# Patient Record
Sex: Female | Born: 1990 | Race: Black or African American | Hispanic: No | Marital: Married | Smoking: Never smoker
Health system: Southern US, Community
[De-identification: ages and names within clinical notes are randomized; demographics above are authoritative.]

## PROBLEM LIST (undated history)

## (undated) HISTORY — PX: NO PAST SURGERIES: SHX2092

---

## 2020-03-17 ENCOUNTER — Ambulatory Visit (INDEPENDENT_AMBULATORY_CARE_PROVIDER_SITE_OTHER): Payer: 59 | Admitting: Medical

## 2020-03-17 ENCOUNTER — Other Ambulatory Visit: Payer: Self-pay

## 2020-03-17 ENCOUNTER — Encounter: Payer: Self-pay | Admitting: Family Medicine

## 2020-03-17 VITALS — BP 133/87 | HR 74 | Ht 63.0 in | Wt 126.8 lb

## 2020-03-17 DIAGNOSIS — Z3A09 9 weeks gestation of pregnancy: Secondary | ICD-10-CM | POA: Diagnosis not present

## 2020-03-17 DIAGNOSIS — Z3401 Encounter for supervision of normal first pregnancy, first trimester: Secondary | ICD-10-CM | POA: Diagnosis not present

## 2020-03-17 DIAGNOSIS — R110 Nausea: Secondary | ICD-10-CM | POA: Diagnosis not present

## 2020-03-17 DIAGNOSIS — Z3201 Encounter for pregnancy test, result positive: Secondary | ICD-10-CM

## 2020-03-17 DIAGNOSIS — Z3403 Encounter for supervision of normal first pregnancy, third trimester: Secondary | ICD-10-CM | POA: Insufficient documentation

## 2020-03-17 LAB — POCT PREGNANCY, URINE: Preg Test, Ur: POSITIVE — AB

## 2020-03-17 NOTE — Patient Instructions (Signed)
First Trimester of Pregnancy  The first trimester of pregnancy is from week 1 until the end of week 13 (months 1 through 3). During this time, your baby will begin to develop inside you. At 6-8 weeks, the eyes and face are formed, and the heartbeat can be seen on ultrasound. At the end of 12 weeks, all the baby's organs are formed. Prenatal care is all the medical care you receive before the birth of your baby. Make sure you get good prenatal care and follow all of your doctor's instructions. Follow these instructions at home: Medicines  Take over-the-counter and prescription medicines only as told by your doctor. Some medicines are safe and some medicines are not safe during pregnancy.  Take a prenatal vitamin that contains at least 600 micrograms (mcg) of folic acid.  If you have trouble pooping (constipation), take medicine that will make your stool soft (stool softener) if your doctor approves. Eating and drinking   Eat regular, healthy meals.  Your doctor will tell you the amount of weight gain that is right for you.  Avoid raw meat and uncooked cheese.  If you feel sick to your stomach (nauseous) or throw up (vomit): ? Eat 4 or 5 small meals a day instead of 3 large meals. ? Try eating a few soda crackers. ? Drink liquids between meals instead of during meals.  To prevent constipation: ? Eat foods that are high in fiber, like fresh fruits and vegetables, whole grains, and beans. ? Drink enough fluids to keep your pee (urine) clear or pale yellow. Activity  Exercise only as told by your doctor. Stop exercising if you have cramps or pain in your lower belly (abdomen) or low back.  Do not exercise if it is too hot, too humid, or if you are in a place of great height (high altitude).  Try to avoid standing for long periods of time. Move your legs often if you must stand in one place for a long time.  Avoid heavy lifting.  Wear low-heeled shoes. Sit and stand up  straight.  You can have sex unless your doctor tells you not to. Relieving pain and discomfort  Wear a good support bra if your breasts are sore.  Take warm water baths (sitz baths) to soothe pain or discomfort caused by hemorrhoids. Use hemorrhoid cream if your doctor says it is okay.  Rest with your legs raised if you have leg cramps or low back pain.  If you have puffy, bulging veins (varicose veins) in your legs: ? Wear support hose or compression stockings as told by your doctor. ? Raise (elevate) your feet for 15 minutes, 3-4 times a day. ? Limit salt in your food. Prenatal care  Schedule your prenatal visits by the twelfth week of pregnancy.  Write down your questions. Take them to your prenatal visits.  Keep all your prenatal visits as told by your doctor. This is important. Safety  Wear your seat belt at all times when driving.  Make a list of emergency phone numbers. The list should include numbers for family, friends, the hospital, and police and fire departments. General instructions  Ask your doctor for a referral to a local prenatal class. Begin classes no later than at the start of month 6 of your pregnancy.  Ask for help if you need counseling or if you need help with nutrition. Your doctor can give you advice or tell you where to go for help.  Do not use hot tubs, steam   rooms, or saunas.  Do not douche or use tampons or scented sanitary pads.  Do not cross your legs for long periods of time.  Avoid all herbs and alcohol. Avoid drugs that are not approved by your doctor.  Do not use any tobacco products, including cigarettes, chewing tobacco, and electronic cigarettes. If you need help quitting, ask your doctor. You may get counseling or other support to help you quit.  Avoid cat litter boxes and soil used by cats. These carry germs that can cause birth defects in the baby and can cause a loss of your baby (miscarriage) or stillbirth.  Visit your dentist.  At home, brush your teeth with a soft toothbrush. Be gentle when you floss. Contact a doctor if:  You are dizzy.  You have mild cramps or pressure in your lower belly.  You have a nagging pain in your belly area.  You continue to feel sick to your stomach, you throw up, or you have watery poop (diarrhea).  You have a bad smelling fluid coming from your vagina.  You have pain when you pee (urinate).  You have increased puffiness (swelling) in your face, hands, legs, or ankles. Get help right away if:  You have a fever.  You are leaking fluid from your vagina.  You have spotting or bleeding from your vagina.  You have very bad belly cramping or pain.  You gain or lose weight rapidly.  You throw up blood. It may look like coffee grounds.  You are around people who have German measles, fifth disease, or chickenpox.  You have a very bad headache.  You have shortness of breath.  You have any kind of trauma, such as from a fall or a car accident. Summary  The first trimester of pregnancy is from week 1 until the end of week 13 (months 1 through 3).  To take care of yourself and your unborn baby, you will need to eat healthy meals, take medicines only if your doctor tells you to do so, and do activities that are safe for you and your baby.  Keep all follow-up visits as told by your doctor. This is important as your doctor will have to ensure that your baby is healthy and growing well. This information is not intended to replace advice given to you by your health care provider. Make sure you discuss any questions you have with your health care provider. Document Revised: 10/29/2018 Document Reviewed: 07/16/2016 Elsevier Patient Education  2020 Elsevier Inc.  

## 2020-03-17 NOTE — Progress Notes (Signed)
Pt here for UPT-+ , LMP: 01/13/20, EDD: 10/19/20, GA: [redacted]w[redacted]d. Verified that Pt is not on any other medications, asked pt to start Prenatal vitamins as soon as possible. Pt states has been nauseated but no vomiting. Asked Pt did she know where she wanted to get prenatal care, she stated this office is closest to home her to let front office know at check out. Pt verbalized understanding.

## 2020-03-17 NOTE — Progress Notes (Signed)
  History:  Ms. Tammy Park is a 29 y.o. G1P0 who presents to clinic today with complaint of possible pregnancy. She states LMP 01/13/20 and +HPT. She denies pain, bleeding today. She has had morning nausea without vomiting.   The following portions of the patient's history were reviewed and updated as appropriate: allergies, current medications and problem list.   Review of Systems:  Pertinent items noted in HPI and remainder of comprehensive ROS otherwise negative.  Objective:  Physical Exam BP 133/87   Pulse 74   Ht 5\' 3"  (1.6 m)   Wt 126 lb 12.8 oz (57.5 kg)   LMP 01/13/2020 (Exact Date)   BMI 22.46 kg/m  Physical Exam Vitals and nursing note reviewed.  Constitutional:      General: She is not in acute distress.    Appearance: She is normal weight.  Cardiovascular:     Rate and Rhythm: Normal rate.  Pulmonary:     Effort: Pulmonary effort is normal.  Abdominal:     General: Abdomen is flat. There is no distension.  Neurological:     Mental Status: She is alert and oriented to person, place, and time.  Psychiatric:        Mood and Affect: Mood normal.     Labs and Imaging Results for orders placed or performed in visit on 03/17/20 (from the past 24 hour(s))  Pregnancy, urine POC     Status: Abnormal   Collection Time: 03/17/20  8:59 AM  Result Value Ref Range   Preg Test, Ur POSITIVE (A) NEGATIVE    Assessment & Plan:  1. Positive pregnancy test - Rx for prenatal vitamins sent to patient's pharmacy  - Discussed first trimester warning signs and when to present to [facility] - Will schedule for new OB intake/first prenatal visit in 3-4 weeks  2. [redacted] weeks gestation of pregnancy  Approximately 10 minutes of total time was spent with this patient on history taking, chart review and documentation.  03/19/20, PA-C 03/17/2020 9:30 AM

## 2020-04-13 ENCOUNTER — Other Ambulatory Visit (HOSPITAL_COMMUNITY)
Admission: RE | Admit: 2020-04-13 | Discharge: 2020-04-13 | Disposition: A | Discharge status: Home / Self Care | Payer: 59 | Source: Ambulatory Visit

## 2020-04-13 ENCOUNTER — Other Ambulatory Visit: Payer: Self-pay

## 2020-04-13 ENCOUNTER — Ambulatory Visit (INDEPENDENT_AMBULATORY_CARE_PROVIDER_SITE_OTHER): Payer: 59

## 2020-04-13 VITALS — BP 110/77 | HR 96 | Wt 126.0 lb

## 2020-04-13 DIAGNOSIS — Z3A13 13 weeks gestation of pregnancy: Secondary | ICD-10-CM

## 2020-04-13 DIAGNOSIS — Z124 Encounter for screening for malignant neoplasm of cervix: Secondary | ICD-10-CM | POA: Diagnosis present

## 2020-04-13 DIAGNOSIS — Z3401 Encounter for supervision of normal first pregnancy, first trimester: Secondary | ICD-10-CM

## 2020-04-13 NOTE — Progress Notes (Signed)
Subjective:   Tammy Park is a 29 y.o. G1P0 at [redacted]w[redacted]d by Definite LMP of January 13, 2020 being seen today for her first obstetrical visit for her first pregnancy!  Patient and partner reports this was a planned pregnancy.  Patient and husband report that they married in [place] this past May and patient relocated to [place] in July. Patient and husband feel she is adjusting well.   Gynecological/Obstetrical History: Her obstetrical history is unremarkable. Patient does intend to breast feed. Pregnancy history fully reviewed. Patient reports no complaints. Sexual Activity and Vaginal Concerns: Some occasional pain or discomfort during sex, but not of concern.  Patient states "it just happens in the moment and then it is done."   Medical History/ROS: Patient denies significant medical history.     Social History: Patient denies history or current usage of tobacco, alcohol, or drugs.  Patient reports the FOB is Darin Engels who is present, involved, and supportive.  Patient reports that she lives with Darin Engels and endorses safety at home.  Patient denies DV/A. Patient is not currently employed.  HISTORY: OB History  Gravida Para Term Preterm AB Living  1 0 0 0 0 0  SAB TAB Ectopic Multiple Live Births  0 0 0 0 0    # Outcome Date GA Lbr Len/2nd Weight Sex Delivery Anes PTL Lv  1 Current             Last pap smear was done today and is pending.   No past medical history on file. No past surgical history on file. No family history on file. Social History   Tobacco Use  . Smoking status: Unknown If Ever Smoked  Substance Use Topics  . Alcohol use: Not on file  . Drug use: Not on file   No Known Allergies Current Outpatient Medications on File Prior to Visit  Medication Sig Dispense Refill  . Prenatal Vit-Fe Fumarate-FA (PRENATAL MULTIVITAMIN) TABS tablet Take 1 tablet by mouth daily at 12 noon.     No current facility-administered medications on file prior to visit.    Review of  Systems Pertinent items noted in HPI and remainder of comprehensive ROS otherwise negative.  Exam   There were no vitals filed for this visit.    Physical Exam Constitutional:      Appearance: Normal appearance.  Genitourinary:     Genitourinary Comments: Sterile Speculum Exam: -Normal External Genitalia: Non tender, no apparent discharge at introitus.  -Vaginal Vault: Pink mucosa with good rugae. Small amt thin white discharge - -Cervix:Pink, no lesions, cysts, or polyps.  Ecotropian appearance.  OS appears closed. No active bleeding noted-pap collected with brush and spatula -Bimanual Exam:  Uterine size difficult to assess d/t position.    HENT:     Head: Normocephalic and atraumatic.  Eyes:     Conjunctiva/sclera: Conjunctivae normal.  Cardiovascular:     Rate and Rhythm: Normal rate and regular rhythm.     Heart sounds: Normal heart sounds.  Pulmonary:     Effort: Pulmonary effort is normal. No respiratory distress.     Breath sounds: Normal breath sounds.  Chest:     Comments: CBE Deferred Abdominal:     General: Abdomen is flat. There is no distension.     Palpations: Abdomen is soft.     Tenderness: There is no abdominal tenderness.  Musculoskeletal:     Cervical back: Normal range of motion.  Neurological:     Mental Status: She is alert and oriented  to person, place, and time.  Skin:    General: Skin is warm and dry.  Psychiatric:        Mood and Affect: Mood normal.        Behavior: Behavior normal.        Thought Content: Thought content normal.        Judgment: Judgment normal.     Assessment:   29 y.o. year old G1P0 Patient Active Problem List   Diagnosis Date Noted  . Supervision of low-risk first pregnancy, first trimester 03/17/2020     Plan:  1. Supervision of low-risk first pregnancy, first trimester -Congratulations given and patient welcomed to practice. -Discussed usage of Babyscripts and virtual visits as additional source of managing  and completing PN visits in midst of coronavirus.   *Instructed to take blood pressure and record weekly into babyscripts. *Reviewed modified prenatal visit schedule and platforms used for virtual visits.  -Anticipatory guidance for prenatal visits including labs, ultrasounds, and testing; Initial labs drawn. -Genetic Screening discussed, First trimester screen and NIPS: ordered. -Encouraged to complete MyChart Registration for her ability to review results, send requests, and have questions addressed.  -Discussed estimated due date of October 19, 2020. -Ultrasound discussed; fetal anatomic survey: ordered. -Initiate prenatal vitamins; Rx sent to pharmacy on file.  -Influenza offered and declined. -Covid injection discussed and patient does not plan to receive during pregnancy. Partner has received his Covid shot.  -Encouraged to seek out care at office or emergency room for urgent and/or emergent concerns. -Educated on the nature of [facility] with multiple MDs and other Advanced Practice Providers was explained to patient; also emphasized that residents, students are part of our team. Informed of her right to refuse care as she deems appropriate.  -No questions or concerns.   2. [redacted] weeks gestation of pregnancy -Anticipatory guidance for upcoming appts. -Extensive discussion regarding Covid vaccine including current recommendations, research, and risks vs benefits. -Patient and husband encouraged to research, ask questions, and receive vaccine as they deem fit for their family and lifestyle.   3. Cervical cancer screening -Introduced to instruments used in well woman exam. -Discussed reason for testing and releasing of results.  -Pap smear collected.    Problem list reviewed and updated. Routine obstetric precautions reviewed.  Orders Placed This Encounter  Procedures  . Culture, OB Urine  . [place] MFM OB COMP + 14 WK    Standing Status:   Future     Standing Expiration Date:   04/13/2021    Order Specific Question:   Reason for Exam (SYMPTOM  OR DIAGNOSIS REQUIRED)    Answer:   Anatomy    Order Specific Question:   Preferred Location    Answer:   [facility] Ul[facility]  . CBC/D/Plt+RPR+Rh+ABO+Rub Ab...  . Genetic Screening    [facility]    Return in about 5 weeks (around 05/18/2020) for Virtual LR-ROB.     Cherre Robins, CNM 04/13/2020 9:10 AM

## 2020-04-13 NOTE — Patient Instructions (Signed)

## 2020-04-15 LAB — CULTURE, OB URINE

## 2020-04-15 LAB — URINE CULTURE, OB REFLEX: Organism ID, Bacteria: NO GROWTH

## 2020-04-16 LAB — CBC/D/PLT+RPR+RH+ABO+RUB AB...
Antibody Screen: NEGATIVE
Basophils Absolute: 0.1 10*3/uL (ref 0.0–0.2)
Basos: 1 %
EOS (ABSOLUTE): 0.1 10*3/uL (ref 0.0–0.4)
Eos: 1 %
HCV Ab: <0.1 s/co ratio (ref 0.0–0.9)
HIV Screen 4th Generation wRfx: NONREACTIVE
Hematocrit: 36.8 % (ref 34.0–46.6)
Hemoglobin: 12.3 g/dL (ref 11.1–15.9)
Hepatitis B Surface Ag: NEGATIVE
Immature Grans (Abs): 0 10*3/uL (ref 0.0–0.1)
Immature Granulocytes: 0 %
Lymphocytes Absolute: 1.6 10*3/uL (ref 0.7–3.1)
Lymphs: 17 %
MCH: 30.1 pg (ref 26.6–33.0)
MCHC: 33.4 g/dL (ref 31.5–35.7)
MCV: 90 fL (ref 79–97)
Monocytes Absolute: 0.7 10*3/uL (ref 0.1–0.9)
Monocytes: 8 %
Neutrophils Absolute: 7.2 10*3/uL — ABNORMAL HIGH (ref 1.4–7.0)
Neutrophils: 73 %
Platelets: 191 10*3/uL (ref 150–450)
RBC: 4.08 x10E6/uL (ref 3.77–5.28)
RDW: 13.8 % (ref 11.7–15.4)
RPR Ser Ql: NONREACTIVE
Rh Factor: POSITIVE
Rubella Antibodies, IGG: 18.7 index (ref >0.99)
WBC: 9.8 10*3/uL (ref 3.4–10.8)

## 2020-04-16 LAB — HCV INTERPRETATION

## 2020-04-17 LAB — CYTOLOGY - PAP
Chlamydia: NEGATIVE
Comment: NEGATIVE
Comment: NEGATIVE
Comment: NORMAL
Diagnosis: UNDETERMINED — AB
High risk HPV: NEGATIVE
Neisseria Gonorrhea: NEGATIVE

## 2020-04-18 ENCOUNTER — Encounter (INDEPENDENT_AMBULATORY_CARE_PROVIDER_SITE_OTHER): Payer: Self-pay | Admitting: Primary Care

## 2020-04-28 ENCOUNTER — Encounter (INDEPENDENT_AMBULATORY_CARE_PROVIDER_SITE_OTHER): Payer: Self-pay | Admitting: Primary Care

## 2020-04-28 ENCOUNTER — Ambulatory Visit (INDEPENDENT_AMBULATORY_CARE_PROVIDER_SITE_OTHER): Payer: 59 | Admitting: Primary Care

## 2020-04-28 ENCOUNTER — Other Ambulatory Visit: Payer: Self-pay

## 2020-04-28 VITALS — BP 128/80 | HR 71 | Temp 97.5°F | Ht 64.0 in | Wt 131.8 lb

## 2020-04-28 DIAGNOSIS — Z7689 Persons encountering health services in other specified circumstances: Secondary | ICD-10-CM | POA: Diagnosis not present

## 2020-04-28 DIAGNOSIS — Z3401 Encounter for supervision of normal first pregnancy, first trimester: Secondary | ICD-10-CM

## 2020-04-28 NOTE — Addendum Note (Signed)
Addended by: Grayce Sessions on: 04/28/2020 09:26 AM   Modules accepted: Orders

## 2020-04-28 NOTE — Patient Instructions (Signed)
Earwax Buildup, Adult The ears produce a substance called earwax that helps keep bacteria out of the ear and protects the skin in the ear canal. Occasionally, earwax can build up in the ear and cause discomfort or hearing loss. What increases the risk? This condition is more likely to develop in people who:  Are female.  Are elderly.  Naturally produce more earwax.  Clean their ears often with cotton swabs.  Use earplugs often.  Use in-ear headphones often.  Wear hearing aids.  Have narrow ear canals.  Have earwax that is overly thick or sticky.  Have eczema.  Are dehydrated.  Have excess hair in the ear canal. What are the signs or symptoms? Symptoms of this condition include:  Reduced or muffled hearing.  A feeling of fullness in the ear or feeling that the ear is plugged.  Fluid coming from the ear.  Ear pain.  Ear itch.  Ringing in the ear.  Coughing.  An obvious piece of earwax that can be seen inside the ear canal. How is this diagnosed? This condition may be diagnosed based on:  Your symptoms.  Your medical history.  An ear exam. During the exam, your health care provider will look into your ear with an instrument called an otoscope. You may have tests, including a hearing test. How is this treated? This condition may be treated by:  Using ear drops to soften the earwax.  Having the earwax removed by a health care provider. The health care provider may: ? Flush the ear with water. ? Use an instrument that has a loop on the end (curette). ? Use a suction device.  Surgery to remove the wax buildup. This may be done in severe cases. Follow these instructions at home:   Take over-the-counter and prescription medicines only as told by your health care provider.  Do not put any objects, including cotton swabs, into your ear. You can clean the opening of your ear canal with a washcloth or facial tissue.  Follow instructions from your health care  provider about cleaning your ears. Do not over-clean your ears.  Drink enough fluid to keep your urine clear or pale yellow. This will help to thin the earwax.  Keep all follow-up visits as told by your health care provider. If earwax builds up in your ears often or if you use hearing aids, consider seeing your health care provider for routine, preventive ear cleanings. Ask your health care provider how often you should schedule your cleanings.  If you have hearing aids, clean them according to instructions from the manufacturer and your health care provider. Contact a health care provider if:  You have ear pain.  You develop a fever.  You have blood, pus, or other fluid coming from your ear.  You have hearing loss.  You have ringing in your ears that does not go away.  Your symptoms do not improve with treatment.  You feel like the room is spinning (vertigo). Summary  Earwax can build up in the ear and cause discomfort or hearing loss.  The most common symptoms of this condition include reduced or muffled hearing and a feeling of fullness in the ear or feeling that the ear is plugged.  This condition may be diagnosed based on your symptoms, your medical history, and an ear exam.  This condition may be treated by using ear drops to soften the earwax or by having the earwax removed by a health care provider.  Do not put any   objects, including cotton swabs, into your ear. You can clean the opening of your ear canal with a washcloth or facial tissue. This information is not intended to replace advice given to you by your health care provider. Make sure you discuss any questions you have with your health care provider. Document Revised: 06/20/2017 Document Reviewed: 09/18/2016 Elsevier Patient Education  2020 Elsevier Inc.  

## 2020-04-28 NOTE — Progress Notes (Addendum)
New Patient Office Visit  Subjective:  Patient ID: Tammy Park, female    DOB: October 31, 1990  Age: 29 y.o. MRN: [id number]  CC:  Chief Complaint  Patient presents with  . New Patient (Initial Visit)    HPI Ms. Tammy Park is a 29 year old female who presents for establishment of care. She is [redacted] weeks pregnant and 1 day will refer to gyn . She has established prenatal care with [facility]. Continue follow up appointments  No past medical history on file.  No past surgical history on file.  No family history on file.  Social History   Socioeconomic History  . Marital status: Married    Spouse name: Not on file  . Number of children: Not on file  . Years of education: Not on file  . Highest education level: Not on file  Occupational History  . Not on file  Tobacco Use  . Smoking status: Never Smoker  . Smokeless tobacco: Never Used  Substance and Sexual Activity  . Alcohol use: Never  . Drug use: Never  . Sexual activity: Yes  Other Topics Concern  . Not on file  Social History Narrative  . Not on file   Social Determinants of Health   Financial Resource Strain: Low Risk   . Difficulty of Paying Living Expenses: Not hard at all  Food Insecurity: No Food Insecurity  . Worried About Programme researcher, broadcasting/film/video in the Last Year: Never true  . Ran Out of Food in the Last Year: Never true  Transportation Needs: No Transportation Needs  . Lack of Transportation (Medical): No  . Lack of Transportation (Non-Medical): No  Physical Activity:   . Days of Exercise per Week: Not on file  . Minutes of Exercise per Session: Not on file  Stress: No Stress Concern Present  . Feeling of Stress : Not at all  Social Connections: Socially Integrated  . Frequency of Communication with Friends and Family: Three times a week  . Frequency of Social Gatherings with Friends and Family: Twice a week  . Attends Religious Services: 1 to 4 times per year  . Active Member of  Clubs or Organizations: No  . Attends Banker Meetings: More than 4 times per year  . Marital Status: Married  Catering manager Violence: Not At Risk  . Fear of Current or Ex-Partner: No  . Emotionally Abused: No  . Physically Abused: No  . Sexually Abused: No    ROS Review of Systems  Gastrointestinal: Positive for nausea.       Intermittent pregnant   All other systems reviewed and are negative.   Objective:  BP 128/80 (BP Location: Right Arm, Patient Position: Sitting, Cuff Size: Normal)   Pulse 71   Temp (!) 97.5 F (36.4 C) (Temporal)   Ht 5\' 4"  (1.626 m)   Wt 131 lb 12.8 oz (59.8 kg)   LMP 01/13/2020 (Exact Date)   SpO2 95%   BMI 22.62 kg/m  Today's Vitals: BP 128/80 (BP Location: Right Arm, Patient Position: Sitting, Cuff Size: Normal)   Pulse 71   Temp (!) 97.5 F (36.4 C) (Temporal)   Ht 5\' 4"  (1.626 m)   Wt 131 lb 12.8 oz (59.8 kg)   LMP 01/13/2020 (Exact Date)   SpO2 95%   BMI 22.62 kg/m   Physical Exam Vitals reviewed.  Constitutional:      Appearance: Normal appearance.  HENT:     Head: Normocephalic.  Right Ear: There is impacted cerumen.     Left Ear: Tympanic membrane normal.     Nose: Nose normal.  Cardiovascular:     Rate and Rhythm: Normal rate and regular rhythm.  Pulmonary:     Effort: Pulmonary effort is normal.     Breath sounds: Normal breath sounds.  Abdominal:     General: Bowel sounds are normal.  Musculoskeletal:        General: Normal range of motion.     Cervical back: Normal range of motion.  Skin:    General: Skin is warm and dry.  Neurological:     Mental Status: She is alert and oriented to person, place, and time.  Psychiatric:        Mood and Affect: Mood normal.        Behavior: Behavior normal.        Thought Content: Thought content normal.        Judgment: Judgment normal.     Assessment & Plan:  Tammy Park was seen today for new patient (initial visit).  Diagnoses and all orders for this  visit:  Encounter to establish care Gwinda Passe, NP-C will be your  (PCP) she is mastered prepared . Able to diagnosed and treatment also  answer health concern as well as continuing care of varied medical conditions, not limited by cause, organ system, or diagnosis.   Supervision of low-risk first pregnancy, first trimester She has established prenatal care with [facility]. Continue follow up appointments     Outpatient Encounter Medications as of 04/28/2020  Medication Sig  . Prenatal Vit-Fe Fumarate-FA (PRENATAL MULTIVITAMIN) TABS tablet Take 1 tablet by mouth daily at 12 noon.   No facility-administered encounter medications on file as of 04/28/2020.    Follow-up: Return for schedule for right ear irrigation when she has gyn appt.   Grayce Sessions, NP

## 2020-05-09 ENCOUNTER — Ambulatory Visit (INDEPENDENT_AMBULATORY_CARE_PROVIDER_SITE_OTHER): Payer: 59 | Admitting: Primary Care

## 2020-05-10 ENCOUNTER — Encounter: Payer: Self-pay | Admitting: *Deleted

## 2020-05-11 ENCOUNTER — Encounter: Payer: Self-pay | Admitting: Medical

## 2020-05-12 ENCOUNTER — Other Ambulatory Visit: Payer: Self-pay | Admitting: *Deleted

## 2020-05-12 DIAGNOSIS — Z3401 Encounter for supervision of normal first pregnancy, first trimester: Secondary | ICD-10-CM

## 2020-05-12 MED ORDER — COMPLETENATE 29-1 MG PO CHEW: 1.0000 | CHEWABLE_TABLET | Freq: Every day | ORAL | 11 refills | Status: DC

## 2020-05-15 ENCOUNTER — Other Ambulatory Visit: Payer: Self-pay | Admitting: *Deleted

## 2020-05-15 DIAGNOSIS — Z3401 Encounter for supervision of normal first pregnancy, first trimester: Secondary | ICD-10-CM

## 2020-05-17 ENCOUNTER — Telehealth (INDEPENDENT_AMBULATORY_CARE_PROVIDER_SITE_OTHER): Payer: 59 | Admitting: Nurse Practitioner

## 2020-05-17 VITALS — BP 119/83 | HR 80 | Wt 134.0 lb

## 2020-05-17 DIAGNOSIS — Z3A17 17 weeks gestation of pregnancy: Secondary | ICD-10-CM

## 2020-05-17 DIAGNOSIS — Z3401 Encounter for supervision of normal first pregnancy, first trimester: Secondary | ICD-10-CM

## 2020-05-17 NOTE — Progress Notes (Signed)
I connected with  Tammy Park on 05/17/20 at 0817 by MyChart and verified that I am speaking with the correct person using two identifiers.   I discussed the limitations, risks, security and privacy concerns of performing an evaluation and management service by telephone and the availability of in person appointments. I also discussed with the patient that there may be a patient responsible charge related to this service. The patient expressed understanding and agreed to proceed.  Marjo Bicker, RN 05/17/2020  8:17 AM

## 2020-05-17 NOTE — Progress Notes (Signed)
17 weeks   OBSTETRICS PRENATAL VIRTUAL VISIT ENCOUNTER NOTE  Provider location: Center for [facility] at [facility]   I connected with Earlene Plater on 05/17/20 at  8:15 AM EDT by MyChart Video Encounter at home and verified that I am speaking with the correct person using two identifiers.   I discussed the limitations, risks, security and privacy concerns of performing an evaluation and management service virtually and the availability of in person appointments. I also discussed with the patient that there may be a patient responsible charge related to this service. The patient expressed understanding and agreed to proceed. Subjective:  Yentl Verge is a 29 y.o. G1P0 at [redacted]w[redacted]d being seen today for ongoing prenatal care.  She is currently monitored for the following issues for this low-risk pregnancy and has Supervision of low-risk first pregnancy, first trimester on their problem list.  Patient reports worried about protein intake.  Contractions: Not present. Vag. Bleeding: None.  Movement: Absent. Denies any leaking of fluid.   The following portions of the patient's history were reviewed and updated as appropriate: allergies, current medications, past family history, past medical history, past social history, past surgical history and problem list.   Objective:   Vitals:   05/17/20 0818  BP: 119/83  Pulse: 80  Weight: 134 lb (60.8 kg)    Fetal Status:     Movement: Absent     General:  Alert, oriented and cooperative. Patient is in no acute distress.  Respiratory: Normal respiratory effort, no problems with respiration noted  Mental Status: Normal mood and affect. Normal behavior. Normal judgment and thought content.  Rest of physical exam deferred due to type of encounter  Imaging: No results found.  Assessment and Plan:  Pregnancy: G1P0 at [redacted]w[redacted]d 1. Supervision of low-risk first pregnancy, first trimester Client has moved to [place]. Will plan  to get AFP drawn at this office when she comes for ultrasound on 05-26-20. Will also transfer her to [facility] office as it is closer to her. Established care at [facility] and will go there to have her ear flushed. Reviewed calling this office for any medical problems during the pregnancy. Reviewed signing up for childbirth and breastfeeding classes on [employer] website. Reviewed sources of protein for her diet.  Doing well.  Not able to eat as much fish as she would like.  Preterm labor symptoms and general obstetric precautions including but not limited to vaginal bleeding, contractions, leaking of fluid and fetal movement were reviewed in detail with the patient. I discussed the assessment and treatment plan with the patient. The patient was provided an opportunity to ask questions and all were answered. The patient agreed with the plan and demonstrated an understanding of the instructions. The patient was advised to call back or seek an in-person office evaluation/go to MAU at [facility] for any urgent or concerning symptoms. Please refer to After Visit Summary for other counseling recommendations.   I provided 10 minutes of face-to-face time during this encounter.  Return for Needs lab only visit for 11/5 and needs to transfer to [facility] for ob visits - next one in 4 wk.  Future Appointments  Date Time Provider Department Center  05/26/2020  8:15 AM [facility] US2 [facility]    Currie Paris, NP Center for [employer], [facility]

## 2020-05-24 ENCOUNTER — Encounter: Payer: Self-pay | Admitting: *Deleted

## 2020-05-26 ENCOUNTER — Other Ambulatory Visit: Payer: Self-pay

## 2020-05-26 ENCOUNTER — Ambulatory Visit: Payer: 59

## 2020-05-26 ENCOUNTER — Other Ambulatory Visit: Payer: Self-pay | Admitting: *Deleted

## 2020-05-26 DIAGNOSIS — Z3401 Encounter for supervision of normal first pregnancy, first trimester: Secondary | ICD-10-CM | POA: Diagnosis not present

## 2020-05-26 DIAGNOSIS — Z362 Encounter for other antenatal screening follow-up: Secondary | ICD-10-CM

## 2020-06-20 ENCOUNTER — Encounter: Payer: Self-pay | Admitting: Advanced Practice Midwife

## 2020-06-20 ENCOUNTER — Ambulatory Visit (INDEPENDENT_AMBULATORY_CARE_PROVIDER_SITE_OTHER): Payer: 59 | Admitting: Advanced Practice Midwife

## 2020-06-20 ENCOUNTER — Other Ambulatory Visit: Payer: Self-pay

## 2020-06-20 VITALS — BP 129/80 | HR 102 | Wt 148.0 lb

## 2020-06-20 DIAGNOSIS — Z3401 Encounter for supervision of normal first pregnancy, first trimester: Secondary | ICD-10-CM

## 2020-06-20 NOTE — Patient Instructions (Signed)

## 2020-06-20 NOTE — Progress Notes (Signed)
   PRENATAL VISIT NOTE  Subjective:  Tammy Park is a 29 y.o. G1P0 at [redacted]w[redacted]d being seen today for ongoing prenatal care.  She is currently monitored for the following issues for this low-risk pregnancy and has Supervision of low-risk first pregnancy, first trimester on their problem list.  Patient reports no complaints.  Contractions: Not present. Vag. Bleeding: None.  Movement: Present. Denies leaking of fluid.   The following portions of the patient's history were reviewed and updated as appropriate: allergies, current medications, past family history, past medical history, past social history, past surgical history and problem list.   Objective:   Vitals:   06/20/20 0832  BP: 129/80  Pulse: (!) 102  Weight: 148 lb (67.1 kg)    Fetal Status: Fetal Heart Rate (bpm): 140   Movement: Present     General:  Alert, oriented and cooperative. Patient is in no acute distress.  Skin: Skin is warm and dry. No rash noted.   Cardiovascular: Normal heart rate noted  Respiratory: Normal respiratory effort, no problems with respiration noted  Abdomen: Soft, gravid, appropriate for gestational age.  Pain/Pressure: Present     Pelvic: Cervical exam deferred        Extremities: Normal range of motion.  Edema: None  Mental Status: Normal mood and affect. Normal behavior. Normal judgment and thought content.   Assessment and Plan:  Pregnancy: G1P0 at [redacted]w[redacted]d 1. Supervision of low-risk first pregnancy, first trimester      [place] reviewed, normal , Anterior placenta         Followup scheduled to complete anatomy       Discussed we deliver at [facility]. They thought we delivered at [facility].  Discussed.  They will continue coming here, reassured first babies usually take a while and commute should not be a problem        Glucola in 5 wks  Preterm labor symptoms and general obstetric precautions including but not limited to vaginal bleeding, contractions, leaking of fluid and fetal movement were  reviewed in detail with the patient. Please refer to After Visit Summary for other counseling recommendations.   Return in about 5 weeks (around 07/25/2020) for [facility].  Future Appointments  Date Time Provider Department Center  06/23/2020  7:15 AM [facility] NURSE [facility]  06/23/2020  7:30 AM [facility] US3 [facility]  07/25/2020  8:10 AM Aviva Signs, CNM [facility] None    Wynelle Bourgeois, CNM

## 2020-06-23 ENCOUNTER — Encounter: Payer: Self-pay | Admitting: *Deleted

## 2020-06-23 ENCOUNTER — Ambulatory Visit: Payer: 59 | Attending: Obstetrics and Gynecology

## 2020-06-23 ENCOUNTER — Ambulatory Visit: Payer: 59 | Admitting: *Deleted

## 2020-06-23 ENCOUNTER — Other Ambulatory Visit: Payer: Self-pay

## 2020-06-23 VITALS — BP 118/67 | HR 80

## 2020-06-23 DIAGNOSIS — Z362 Encounter for other antenatal screening follow-up: Secondary | ICD-10-CM

## 2020-06-23 DIAGNOSIS — Z3A21 21 weeks gestation of pregnancy: Secondary | ICD-10-CM

## 2020-07-25 ENCOUNTER — Encounter: Payer: Self-pay | Admitting: Nurse Practitioner

## 2020-07-25 ENCOUNTER — Ambulatory Visit (INDEPENDENT_AMBULATORY_CARE_PROVIDER_SITE_OTHER): Payer: 59 | Admitting: Nurse Practitioner

## 2020-07-25 ENCOUNTER — Other Ambulatory Visit: Payer: Self-pay

## 2020-07-25 VITALS — BP 113/66 | HR 80 | Wt 156.0 lb

## 2020-07-25 DIAGNOSIS — Z3A26 26 weeks gestation of pregnancy: Secondary | ICD-10-CM

## 2020-07-25 DIAGNOSIS — Z23 Encounter for immunization: Secondary | ICD-10-CM | POA: Diagnosis not present

## 2020-07-25 DIAGNOSIS — Z3401 Encounter for supervision of normal first pregnancy, first trimester: Secondary | ICD-10-CM

## 2020-07-25 NOTE — Progress Notes (Signed)
    Subjective:  Tammy Park is a 30 y.o. G1P0 at [redacted]w[redacted]d being seen today for ongoing prenatal care.  She is currently monitored for the following issues for this low-risk pregnancy and has Supervision of low-risk first pregnancy, first trimester on their problem list.  Patient reports no complaints.  Contractions: Not present. Vag. Bleeding: None.  Movement: Present. Denies leaking of fluid.   The following portions of the patient's history were reviewed and updated as appropriate: allergies, current medications, past family history, past medical history, past social history, past surgical history and problem list. Problem list updated.  Objective:   Vitals:   07/25/20 0817  BP: 113/66  Pulse: 80  Weight: 156 lb (70.8 kg)    Fetal Status: Fetal Heart Rate (bpm): 140 Fundal Height: 28 cm Movement: Present     General:  Alert, oriented and cooperative. Patient is in no acute distress.  Skin: Skin is warm and dry. No rash noted.   Cardiovascular: Normal heart rate noted  Respiratory: Normal respiratory effort, no problems with respiration noted  Abdomen: Soft, gravid, appropriate for gestational age. Pain/Pressure: Present     Pelvic:  Cervical exam deferred        Extremities: Normal range of motion.  Edema: None  Mental Status: Normal mood and affect. Normal behavior. Normal judgment and thought content.   Urinalysis:      Assessment and Plan:  Pregnancy: G1P0 at [redacted]w[redacted]d  1. Supervision of low-risk first pregnancy, first trimester Plans to go to be where her sister can assist her with the birth.  So plans to leave [place] around 37 weeks and give birth in [place].  Advised to look for in network OB in [place] and Optometrist in [place].  Also find Pediatrician in [place] area as she plans to return here approx 3-4 months after the baby is born Is looking into childbirth and breastfeeding classes here  2. [redacted] weeks gestation of pregnancy  - CBC - Glucose Tolerance, 2 Hours  w/1 Hour - HIV Antibody (routine testing w rflx) - RPR - Tdap vaccine greater than or equal to 7yo IM  Preterm labor symptoms and general obstetric precautions including but not limited to vaginal bleeding, contractions, leaking of fluid and fetal movement were reviewed in detail with the patient. Please refer to After Visit Summary for other counseling recommendations.  Return in about 2 weeks (around 08/08/2020).  Nolene Bernheim, RN, MSN, NP-BC Nurse Practitioner, [facility] for [employer], [facility] 07/25/2020 9:40 AM

## 2020-07-27 ENCOUNTER — Encounter: Payer: Self-pay | Admitting: Nurse Practitioner

## 2020-07-27 DIAGNOSIS — A530 Latent syphilis, unspecified as early or late: Secondary | ICD-10-CM | POA: Insufficient documentation

## 2020-07-27 LAB — CBC
Hematocrit: 36.9 % (ref 34.0–46.6)
Hemoglobin: 12.3 g/dL (ref 11.1–15.9)
MCH: 31.2 pg (ref 26.6–33.0)
MCHC: 33.3 g/dL (ref 31.5–35.7)
MCV: 94 fL (ref 79–97)
Platelets: 186 10*3/uL (ref 150–450)
RBC: 3.94 x10E6/uL (ref 3.77–5.28)
RDW: 12.5 % (ref 11.7–15.4)
WBC: 12.1 10*3/uL — ABNORMAL HIGH (ref 3.4–10.8)

## 2020-07-27 LAB — RPR, QUANT+TP ABS (REFLEX)
Rapid Plasma Reagin, Quant: 1:1 {titer} — ABNORMAL HIGH
T Pallidum Abs: NONREACTIVE

## 2020-07-27 LAB — GLUCOSE TOLERANCE, 2 HOURS W/ 1HR
Glucose, 1 hour: 153 mg/dL (ref 65–179)
Glucose, 2 hour: 128 mg/dL (ref 65–152)
Glucose, Fasting: 80 mg/dL (ref 65–91)

## 2020-07-27 LAB — HIV ANTIBODY (ROUTINE TESTING W REFLEX): HIV Screen 4th Generation wRfx: NONREACTIVE

## 2020-07-27 LAB — RPR: RPR Ser Ql: REACTIVE — AB

## 2020-08-08 ENCOUNTER — Encounter: Payer: 59 | Admitting: Advanced Practice Midwife

## 2020-08-10 ENCOUNTER — Ambulatory Visit (INDEPENDENT_AMBULATORY_CARE_PROVIDER_SITE_OTHER): Payer: 59 | Admitting: Family Medicine

## 2020-08-10 ENCOUNTER — Other Ambulatory Visit: Payer: Self-pay

## 2020-08-10 VITALS — BP 117/67 | HR 86 | Wt 160.0 lb

## 2020-08-10 DIAGNOSIS — A530 Latent syphilis, unspecified as early or late: Secondary | ICD-10-CM

## 2020-08-10 DIAGNOSIS — Z3401 Encounter for supervision of normal first pregnancy, first trimester: Secondary | ICD-10-CM

## 2020-08-10 DIAGNOSIS — Z3A28 28 weeks gestation of pregnancy: Secondary | ICD-10-CM

## 2020-08-10 NOTE — Progress Notes (Signed)
   PRENATAL VISIT NOTE  Subjective:  Tammy Park is a 30 y.o. G1P0 at [redacted]w[redacted]d being seen today for ongoing prenatal care.  She is currently monitored for the following issues for this low-risk pregnancy and has Supervision of low-risk first pregnancy, first trimester and Positive RPR test on their problem list.  Patient reports no complaints.  Contractions: Not present. Vag. Bleeding: None.  Movement: Present. Denies leaking of fluid.   The following portions of the patient's history were reviewed and updated as appropriate: allergies, current medications, past family history, past medical history, past social history, past surgical history and problem list.   Objective:   Vitals:   08/10/20 1613  BP: 117/67  Pulse: 86  Weight: 160 lb (72.6 kg)    Fetal Status: Fetal Heart Rate (bpm): 148   Movement: Present     General:  Alert, oriented and cooperative. Patient is in no acute distress.  Skin: Skin is warm and dry. No rash noted.   Cardiovascular: Normal heart rate noted  Respiratory: Normal respiratory effort, no problems with respiration noted  Abdomen: Soft, gravid, appropriate for gestational age.  Pain/Pressure: Absent     Pelvic: Cervical exam deferred        Extremities: Normal range of motion.  Edema: None  Mental Status: Normal mood and affect. Normal behavior. Normal judgment and thought content.   Assessment and Plan:  Pregnancy: G1P0 at [redacted]w[redacted]d 1. [redacted] weeks gestation of pregnancy  2. Supervision of low-risk first pregnancy, first trimester FHT and FH normal. Reviewed 28wk labs with her.  3. Positive RPR test Had positive RPR (1:1 titer), but negative Tpal abs.  Reassured patient that this is likely false positive. Patient would still like to recheck, which we will next visit.  Preterm labor symptoms and general obstetric precautions including but not limited to vaginal bleeding, contractions, leaking of fluid and fetal movement were reviewed in detail with the  patient. Please refer to After Visit Summary for other counseling recommendations.   Return in about 2 weeks (around 08/24/2020) for LR OB f/u, In Office.  No future appointments.  Levie Heritage, DO

## 2020-08-25 ENCOUNTER — Encounter: Payer: Self-pay | Admitting: Obstetrics & Gynecology

## 2020-08-25 ENCOUNTER — Ambulatory Visit (INDEPENDENT_AMBULATORY_CARE_PROVIDER_SITE_OTHER): Payer: 59 | Admitting: Obstetrics & Gynecology

## 2020-08-25 ENCOUNTER — Other Ambulatory Visit: Payer: Self-pay

## 2020-08-25 VITALS — BP 113/64 | HR 87 | Wt 160.0 lb

## 2020-08-25 DIAGNOSIS — Z3403 Encounter for supervision of normal first pregnancy, third trimester: Secondary | ICD-10-CM

## 2020-08-25 DIAGNOSIS — Z3A30 30 weeks gestation of pregnancy: Secondary | ICD-10-CM

## 2020-08-25 NOTE — Patient Instructions (Signed)
Return to office for any scheduled appointments. Call the office or go to the MAU at [facility] if:  You begin to have strong, frequent contractions  Your water breaks.  Sometimes it is a big gush of fluid, sometimes it is just a trickle that keeps getting your panties wet or running down your legs  You have vaginal bleeding.  It is normal to have a small amount of spotting if your cervix was checked.   You do not feel your baby moving like normal.  If you do not, get something to eat and drink and lay down and focus on feeling your baby move.   If your baby is still not moving like normal, you should call the office or go to MAU.  Any other obstetric concerns.   

## 2020-08-25 NOTE — Progress Notes (Addendum)
   PRENATAL VISIT NOTE  Subjective:  Tammy Park is a 30 y.o. G1P0 at [redacted]w[redacted]d being seen today for ongoing prenatal care.  She is currently monitored for the following issues for this low-risk pregnancy and has Supervision of low-risk first pregnancy, third trimester and Positive RPR test on their problem list.  Patient reports no complaints.  Contractions: Not present. Vag. Bleeding: None.  Movement: Present. Denies leaking of fluid.   The following portions of the patient's history were reviewed and updated as appropriate: allergies, current medications, past family history, past medical history, past social history, past surgical history and problem list.   Objective:   Vitals:   08/25/20 0931  BP: 113/64  Pulse: 87  Weight: 160 lb (72.6 kg)    Fetal Status: Fetal Heart Rate (bpm): 133 Fundal Height: 31 cm Movement: Present     General:  Alert, oriented and cooperative. Patient is in no acute distress.  Skin: Skin is warm and dry. No rash noted.   Cardiovascular: Normal heart rate noted  Respiratory: Normal respiratory effort, no problems with respiration noted  Abdomen: Soft, gravid, appropriate for gestational age.  Pain/Pressure: Absent     Pelvic: Cervical exam deferred        Extremities: Normal range of motion.  Edema: None  Mental Status: Normal mood and affect. Normal behavior. Normal judgment and thought content.   Assessment and Plan:  Pregnancy: G1P0 at [redacted]w[redacted]d 1. [redacted] weeks gestation of pregnancy 2. Supervision of low-risk first pregnancy, third trimester Third trimester labs reviewed with patient, no concerns. All questions answered.   Patient is moving to [place] on 09/30/20. Preterm labor symptoms and general obstetric precautions including but not limited to vaginal bleeding, contractions, leaking of fluid and fetal movement were reviewed in detail with the patient. Please refer to After Visit Summary for other counseling recommendations.   Return in about 2  weeks (around 09/08/2020) for OFFICE OB VISIT (MD only).  Future Appointments  Date Time Provider Department Center  09/06/2020  9:15 AM Conan Bowens, MD [facility] None  09/20/2020  8:15 AM Levie Heritage, DO [facility] None    Jaynie Collins, MD

## 2020-09-06 ENCOUNTER — Encounter: Payer: Self-pay | Admitting: Obstetrics and Gynecology

## 2020-09-06 ENCOUNTER — Ambulatory Visit (INDEPENDENT_AMBULATORY_CARE_PROVIDER_SITE_OTHER): Payer: 59 | Admitting: Obstetrics and Gynecology

## 2020-09-06 ENCOUNTER — Other Ambulatory Visit: Payer: Self-pay

## 2020-09-06 VITALS — BP 120/74 | HR 104 | Wt 164.0 lb

## 2020-09-06 DIAGNOSIS — Z3403 Encounter for supervision of normal first pregnancy, third trimester: Secondary | ICD-10-CM

## 2020-09-06 DIAGNOSIS — Z7189 Other specified counseling: Secondary | ICD-10-CM | POA: Diagnosis not present

## 2020-09-06 DIAGNOSIS — A530 Latent syphilis, unspecified as early or late: Secondary | ICD-10-CM

## 2020-09-06 DIAGNOSIS — A539 Syphilis, unspecified: Secondary | ICD-10-CM

## 2020-09-06 DIAGNOSIS — Z3A32 32 weeks gestation of pregnancy: Secondary | ICD-10-CM

## 2020-09-06 DIAGNOSIS — O98113 Syphilis complicating pregnancy, third trimester: Secondary | ICD-10-CM

## 2020-09-06 NOTE — Progress Notes (Signed)
   PRENATAL VISIT NOTE  Subjective:  Tammy Park is a 30 y.o. G1P0 at [redacted]w[redacted]d being seen today for ongoing prenatal care.  She is currently monitored for the following issues for this low-risk pregnancy and has Supervision of low-risk first pregnancy, third trimester and Positive RPR test on their problem list.  Patient reports no complaints.  Contractions: Not present. Vag. Bleeding: None.  Movement: Present. Denies leaking of fluid.   The following portions of the patient's history were reviewed and updated as appropriate: allergies, current medications, past family history, past medical history, past social history, past surgical history and problem list.   Objective:   Vitals:   09/06/20 0916  BP: 120/74  Pulse: (!) 104  Weight: 164 lb (74.4 kg)    Fetal Status: Fetal Heart Rate (bpm): 145 Fundal Height: 31 cm Movement: Present     General:  Alert, oriented and cooperative. Patient is in no acute distress.  Skin: Skin is warm and dry. No rash noted.   Cardiovascular: Normal heart rate noted  Respiratory: Normal respiratory effort, no problems with respiration noted  Abdomen: Soft, gravid, appropriate for gestational age.  Pain/Pressure: Absent     Pelvic: Cervical exam deferred        Extremities: Normal range of motion.  Edema: None  Mental Status: Normal mood and affect. Normal behavior. Normal judgment and thought content.   Assessment and Plan:  Pregnancy: G1P0 at [redacted]w[redacted]d 1. [redacted] weeks gestation of pregnancy  2. Positive RPR test - RPR  3. Supervision of low-risk first pregnancy, third trimester  4. Counseled about COVID-19 virus infection COVID-19 Vaccine Counseling: The patient was counseled on the potential benefits and lack of known risks of COVID vaccination, during pregnancy and breastfeeding, during today's visit. The patient's questions and concerns were addressed today, including safety of the vaccination and potential side effects as they have been  published by ACOG and SMFM. The patient has been informed that there have not been any documented vaccine related injuries, deaths or birth defects to infant or mom after receiving the COVID-19 vaccine to date. The patient has been made aware that although she is not at increased risk of contracting COVID-19 during pregnancy, she is at increased risk of developing severe disease and complications if she contracts COVID-19 while pregnant. All patient questions were addressed during our visit today. The patient is still unsure of her decision for vaccination.    Preterm labor symptoms and general obstetric precautions including but not limited to vaginal bleeding, contractions, leaking of fluid and fetal movement were reviewed in detail with the patient. Please refer to After Visit Summary for other counseling recommendations.   Return in about 2 weeks (around 09/20/2020) for low OB, in person.  Future Appointments  Date Time Provider Department Center  09/20/2020  8:15 AM Levie Heritage, DO [facility] None    Conan Bowens, MD

## 2020-09-08 LAB — RPR: RPR Ser Ql: REACTIVE — AB

## 2020-09-08 LAB — RPR, QUANT+TP ABS (REFLEX)
Rapid Plasma Reagin, Quant: 1:1 {titer} — ABNORMAL HIGH
T Pallidum Abs: NONREACTIVE

## 2020-09-19 ENCOUNTER — Telehealth: Payer: Self-pay

## 2020-09-19 NOTE — Telephone Encounter (Signed)
Patient is calling at 34 weeks. Patient is having intense cramping - denies any bleeding. Patient states baby is moving well but she is having frequent cramping feeling. Patient instructed to proceed to Maternity assesment unit at [facility] - given the address and notified to go to entrance C. Armandina Stammer RN

## 2020-09-20 ENCOUNTER — Other Ambulatory Visit: Payer: Self-pay

## 2020-09-20 ENCOUNTER — Ambulatory Visit (INDEPENDENT_AMBULATORY_CARE_PROVIDER_SITE_OTHER): Payer: 59 | Admitting: Family Medicine

## 2020-09-20 VITALS — BP 113/67 | HR 88 | Wt 159.0 lb

## 2020-09-20 DIAGNOSIS — Z3403 Encounter for supervision of normal first pregnancy, third trimester: Secondary | ICD-10-CM

## 2020-09-20 DIAGNOSIS — Z3A34 34 weeks gestation of pregnancy: Secondary | ICD-10-CM

## 2020-09-20 DIAGNOSIS — A530 Latent syphilis, unspecified as early or late: Secondary | ICD-10-CM

## 2020-09-20 NOTE — Progress Notes (Signed)
   PRENATAL VISIT NOTE  Subjective:  Tammy Park is a 30 y.o. G1P0 at [redacted]w[redacted]d being seen today for ongoing prenatal care.  She is currently monitored for the following issues for this low-risk pregnancy and has Supervision of low-risk first pregnancy, third trimester and Positive RPR test on their problem list.  Patient reports heartburn.  Contractions: Not present. Vag. Bleeding: None.  Movement: Present. Denies leaking of fluid.   The following portions of the patient's history were reviewed and updated as appropriate: allergies, current medications, past family history, past medical history, past social history, past surgical history and problem list.   Objective:   Vitals:   09/20/20 0828  BP: 113/67  Pulse: 88  Weight: 159 lb (72.1 kg)    Fetal Status: Fetal Heart Rate (bpm): 147   Movement: Present     General:  Alert, oriented and cooperative. Patient is in no acute distress.  Skin: Skin is warm and dry. No rash noted.   Cardiovascular: Normal heart rate noted  Respiratory: Normal respiratory effort, no problems with respiration noted  Abdomen: Soft, gravid, appropriate for gestational age.  Pain/Pressure: Present     Pelvic: Cervical exam deferred        Extremities: Normal range of motion.  Edema: None  Mental Status: Normal mood and affect. Normal behavior. Normal judgment and thought content.   Assessment and Plan:  Pregnancy: G1P0 at [redacted]w[redacted]d 1. [redacted] weeks gestation of pregnancy  2. Supervision of low-risk first pregnancy, third trimester FHT and FH normal. TUMs for heartburn.  3. Positive RPR test Old infection. Stable titer. Neg Tpal.  Preterm labor symptoms and general obstetric precautions including but not limited to vaginal bleeding, contractions, leaking of fluid and fetal movement were reviewed in detail with the patient. Please refer to After Visit Summary for other counseling recommendations.   No follow-ups on file.  Future Appointments  Date Time  Provider Department Center  09/28/2020  8:30 AM Levie Heritage, DO [facility] None    Levie Heritage, DO

## 2020-09-28 ENCOUNTER — Other Ambulatory Visit: Payer: Self-pay

## 2020-09-28 ENCOUNTER — Ambulatory Visit (INDEPENDENT_AMBULATORY_CARE_PROVIDER_SITE_OTHER): Payer: 59 | Admitting: Family Medicine

## 2020-09-28 VITALS — BP 122/74 | HR 99 | Wt 164.0 lb

## 2020-09-28 DIAGNOSIS — Z3A35 35 weeks gestation of pregnancy: Secondary | ICD-10-CM | POA: Diagnosis not present

## 2020-09-28 DIAGNOSIS — Z3403 Encounter for supervision of normal first pregnancy, third trimester: Secondary | ICD-10-CM | POA: Diagnosis not present

## 2020-09-28 DIAGNOSIS — A530 Latent syphilis, unspecified as early or late: Secondary | ICD-10-CM

## 2020-09-28 NOTE — Progress Notes (Signed)
   PRENATAL VISIT NOTE  Subjective:  Tammy Park is a 30 y.o. G1P0 at [redacted]w[redacted]d being seen today for ongoing prenatal care.  She is currently monitored for the following issues for this low-risk pregnancy and has Supervision of low-risk first pregnancy, third trimester and Positive RPR test on their problem list.  Patient reports occasional contractions.  Contractions: Irritability. Vag. Bleeding: None.  Movement: Present. Denies leaking of fluid.   The following portions of the patient's history were reviewed and updated as appropriate: allergies, current medications, past family history, past medical history, past social history, past surgical history and problem list.   Objective:   Vitals:   09/28/20 0842  BP: 122/74  Pulse: 99  Weight: 164 lb (74.4 kg)    Fetal Status: Fetal Heart Rate (bpm): 148   Movement: Present     General:  Alert, oriented and cooperative. Patient is in no acute distress.  Skin: Skin is warm and dry. No rash noted.   Cardiovascular: Normal heart rate noted  Respiratory: Normal respiratory effort, no problems with respiration noted  Abdomen: Soft, gravid, appropriate for gestational age.  Pain/Pressure: Present     Pelvic: Cervical exam deferred        Extremities: Normal range of motion.  Edema: None  Mental Status: Normal mood and affect. Normal behavior. Normal judgment and thought content.   Assessment and Plan:  Pregnancy: G1P0 at [redacted]w[redacted]d 1. [redacted] weeks gestation of pregnancy  2. Supervision of low-risk first pregnancy, third trimester FHT and FH normal. Going to [place] this weekend. Plans on delivering there. Has appt in 2 weeks.  3. Positive RPR test Titers 1:1.  Preterm labor symptoms and general obstetric precautions including but not limited to vaginal bleeding, contractions, leaking of fluid and fetal movement were reviewed in detail with the patient. Please refer to After Visit Summary for other counseling recommendations.   No  follow-ups on file.  No future appointments.  Levie Heritage, DO

## 2020-10-09 ENCOUNTER — Other Ambulatory Visit: Payer: Self-pay

## 2020-10-09 DIAGNOSIS — Z3401 Encounter for supervision of normal first pregnancy, first trimester: Secondary | ICD-10-CM

## 2020-10-09 MED ORDER — COMPLETENATE 29-1 MG PO CHEW: 1.0000 | CHEWABLE_TABLET | Freq: Every day | ORAL | 0 refills | Status: DC

## 2020-10-09 MED ORDER — COMPLETENATE 29-1 MG PO CHEW: 1.0000 | CHEWABLE_TABLET | Freq: Every day | ORAL | 0 refills | Status: AC

## 2021-05-31 IMAGING — US US MFM OB FOLLOW-UP
1 series · 14 of 28 positions shown · non-contrast
Comparison: none

[Series 1: us mfm ob follow-up · 44 acquisitions, 14 frames shown]
[im 2/44]
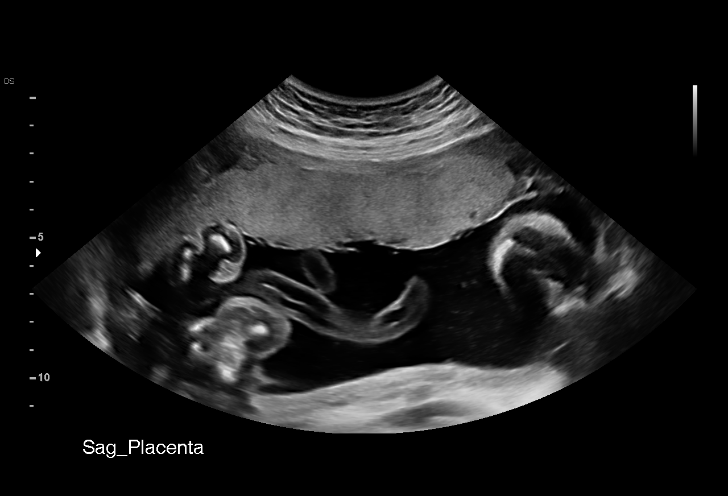
[im 5/44]
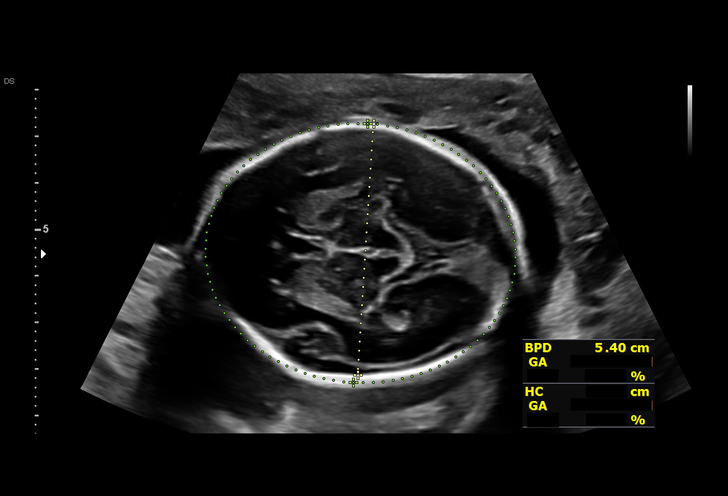
[im 8/44]
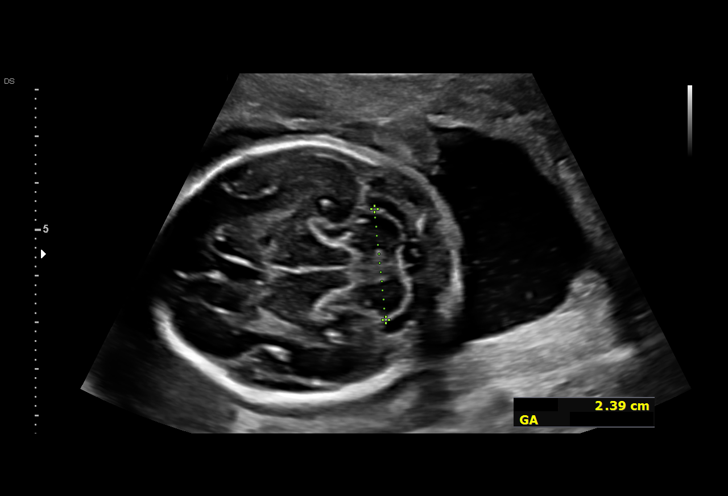
[im 12/44]
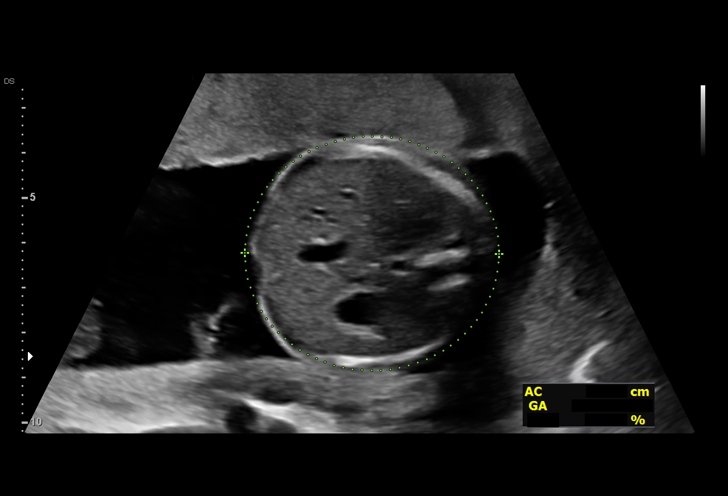
[im 15/44]
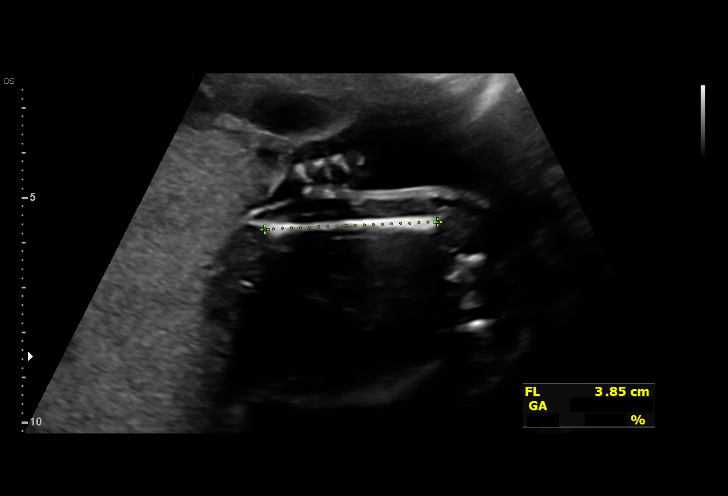
[im 18/44]
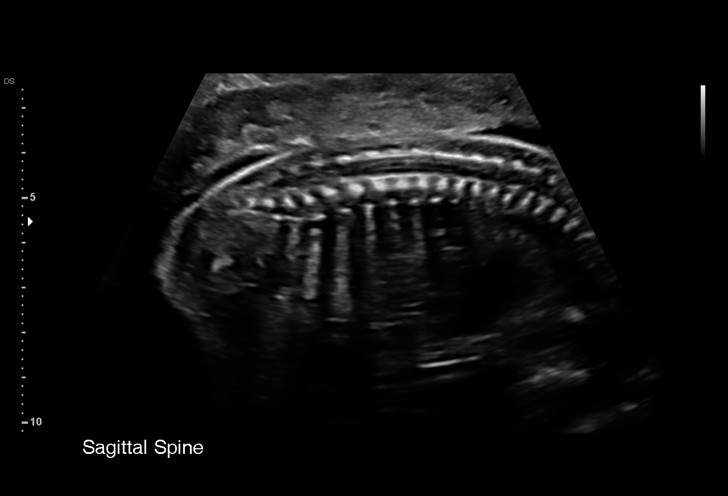
[im 21/44]
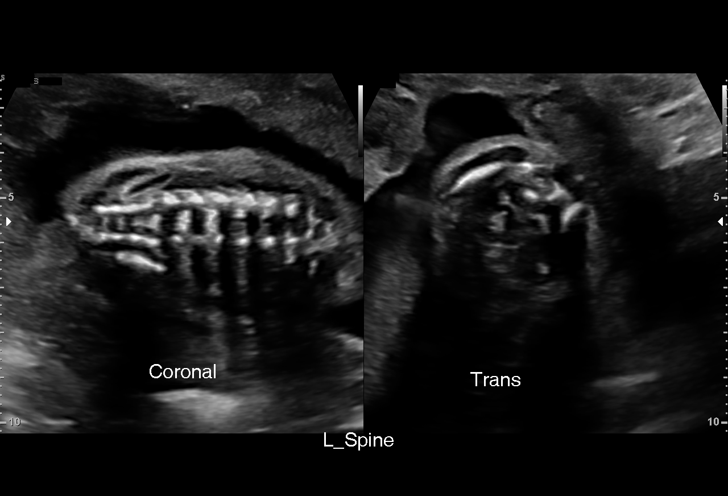
[im 24/44]
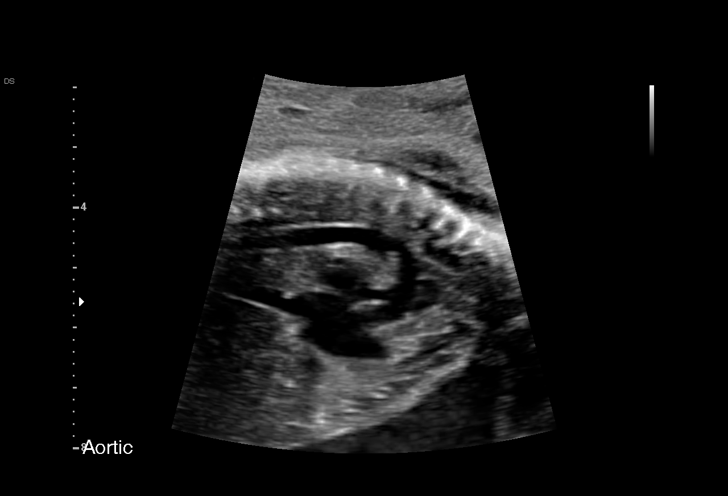
[im 28/44]
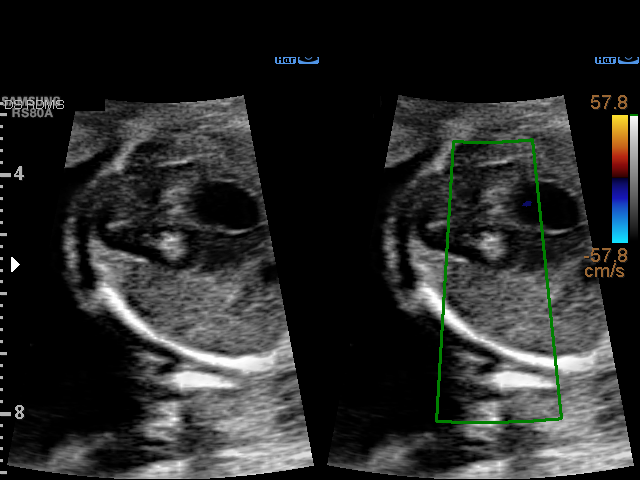
[im 31/44]
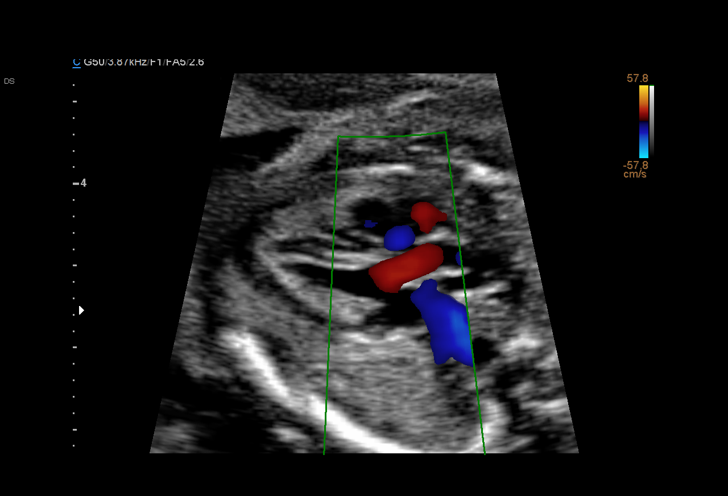
[im 34/44]
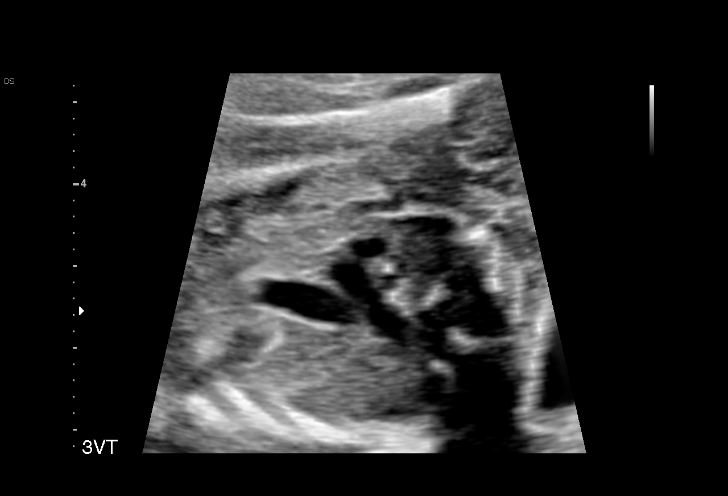
[im 37/44]
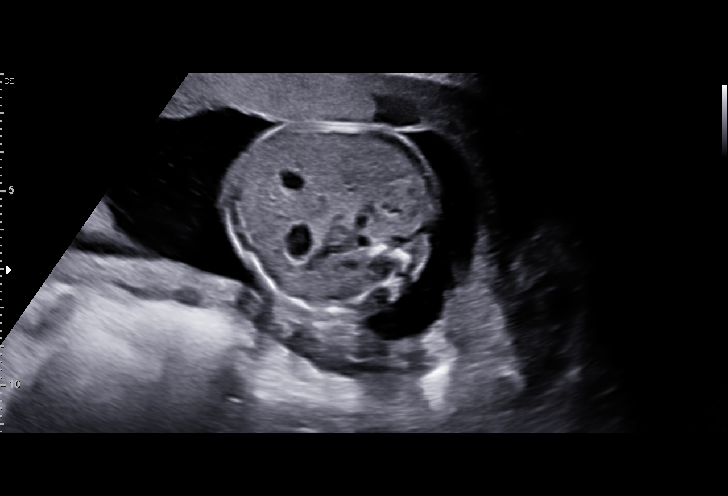
[im 40/44]
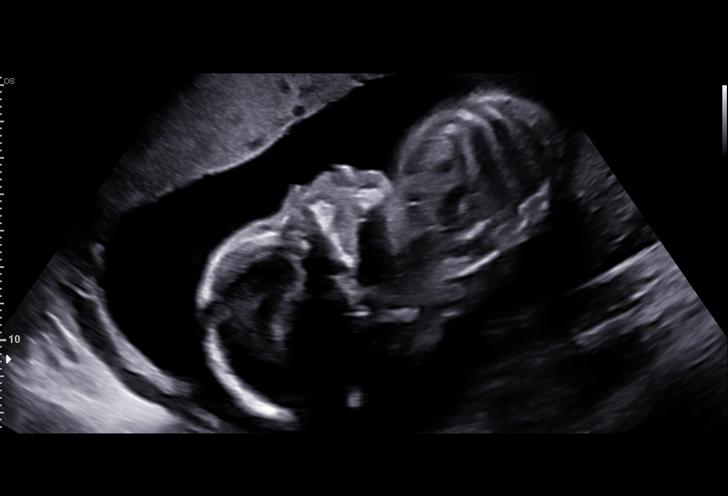
[im 44/44]
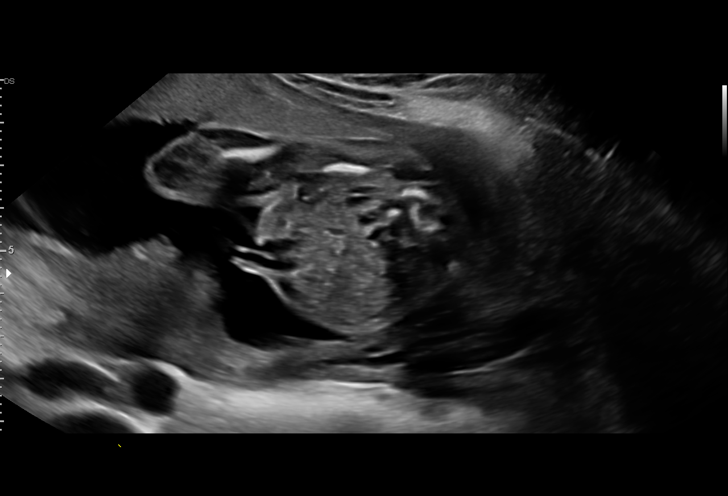

[14 of 28 positions shown; findings below may reference images not displayed]

CNM

Indications

 Encounter for other antenatal screening
 follow-up
 21 weeks gestation of pregnancy
 LR NIPS - male
Fetal Evaluation

 Num Of Fetuses:         1
 Cardiac Activity:       Observed
 Presentation:           Cephalic
 Placenta:               Anterior
 P. Cord Insertion:      Visualized, central

 Amniotic Fluid
 AFI FV:      Within normal limits

                             Largest Pocket(cm)

Biometry

 BPD:      54.2  mm     G. Age:  22w 3d         72  %    CI:        78.79   %    70 - 86
                                                         FL/HC:      19.7   %    18.4 -
 HC:      193.1  mm     G. Age:  21w 4d         26  %    HC/AC:      1.15        1.06 -
 AC:      168.4  mm     G. Age:  21w 6d         42  %    FL/BPD:     70.1   %    71 - 87
 FL:         38  mm     G. Age:  22w 1d         50  %    FL/AC:      22.6   %    20 - 24
 CER:      23.9  mm     G. Age:  22w 0d         74  %
 LV:        6.5  mm

 Est. FW:     465  gm           1 lb     50  %
OB History

 Gravidity:    1
 Living:       0
Gestational Age

 LMP:           23w 1d        Date:  01/13/20                 EDD:   10/19/20
 U/S Today:     22w 0d                                        EDD:   10/27/20
 Best:          21w 6d     Det. By:  U/S  (05/26/20)          EDD:   10/28/20
Anatomy

 Cranium:               Appears normal         LVOT:                   Appears normal
 Cavum:                 Appears normal         Aortic Arch:            Appears normal
 Ventricles:            Appears normal         Ductal Arch:            Appears normal
 Choroid Plexus:        Appears normal         Diaphragm:              Previously seen
 Cerebellum:            Appears normal         Stomach:                Appears normal, left
                                                                       sided
 Posterior Fossa:       Appears normal         Abdomen:                Previously seen
 Nuchal Fold:           Not applicable (>20    Abdominal Wall:         Appears nml (cord
                        wks GA)                                        insert, abd wall)
 Face:                  Appears normal         Cord Vessels:           Previously seen
                        (orbits and profile)
 Lips:                  Appears normal         Kidneys:                Appear normal
 Palate:                Appears normal         Bladder:                Appears normal
 Thoracic:              Appears normal         Spine:                  Appears normal
 Heart:                 Appears normal         Upper Extremities:      Previously seen
                        (4CH, axis, and
                        situs)
 RVOT:                  Appears normal         Lower Extremities:      Previously seen
Cervix Uterus Adnexa

 Cervix
 Length:              4  cm.
 Normal appearance by transabdominal scan.

 Right Ovary
 Within normal limits.

 Left Ovary
 Within normal limits.
Impression

 Patient returned for completion of fetal anatomy .Fetal growth
 is appropriate for gestational age .Amniotic fluid is normal
 and good fetal activity is seen .Fetal anatomical survey was
 completed and appears normal.
Recommendations

 Follow-up scans as clinically indicated.
                 Anrango, Wilmer Alejandro
